# Patient Record
Sex: Male | Born: 2000 | Race: Black or African American | Hispanic: No | Marital: Single | Smoking: Never smoker
Health system: Southern US, Community
[De-identification: ages and names within clinical notes are randomized; demographics above are authoritative.]

## PROBLEM LIST (undated history)

## (undated) DIAGNOSIS — Z8619 Personal history of other infectious and parasitic diseases: Secondary | ICD-10-CM

## (undated) HISTORY — PX: OTHER SURGICAL HISTORY: SHX169

---
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
## 2020-07-07 DIAGNOSIS — H6123 Impacted cerumen, bilateral: Secondary | ICD-10-CM | POA: Diagnosis not present

## 2020-07-07 DIAGNOSIS — Z23 Encounter for immunization: Secondary | ICD-10-CM | POA: Diagnosis not present

## 2020-07-07 DIAGNOSIS — Z0001 Encounter for general adult medical examination with abnormal findings: Secondary | ICD-10-CM | POA: Diagnosis not present

## 2020-07-07 DIAGNOSIS — Z136 Encounter for screening for cardiovascular disorders: Secondary | ICD-10-CM | POA: Diagnosis not present

## 2020-07-07 DIAGNOSIS — Z113 Encounter for screening for infections with a predominantly sexual mode of transmission: Secondary | ICD-10-CM | POA: Diagnosis not present

## 2020-09-30 DIAGNOSIS — T783XXA Angioneurotic edema, initial encounter: Secondary | ICD-10-CM | POA: Diagnosis not present

## 2020-09-30 DIAGNOSIS — T781XXA Other adverse food reactions, not elsewhere classified, initial encounter: Secondary | ICD-10-CM | POA: Diagnosis not present

## 2020-09-30 DIAGNOSIS — R0602 Shortness of breath: Secondary | ICD-10-CM | POA: Diagnosis not present

## 2020-09-30 DIAGNOSIS — X58XXXA Exposure to other specified factors, initial encounter: Secondary | ICD-10-CM | POA: Diagnosis not present

## 2020-09-30 DIAGNOSIS — R220 Localized swelling, mass and lump, head: Secondary | ICD-10-CM | POA: Diagnosis not present

## 2020-10-12 DIAGNOSIS — R062 Wheezing: Secondary | ICD-10-CM | POA: Diagnosis not present

## 2020-10-12 DIAGNOSIS — J302 Other seasonal allergic rhinitis: Secondary | ICD-10-CM | POA: Diagnosis not present

## 2020-10-12 DIAGNOSIS — Z91013 Allergy to seafood: Secondary | ICD-10-CM | POA: Diagnosis not present

## 2020-10-12 DIAGNOSIS — D649 Anemia, unspecified: Secondary | ICD-10-CM | POA: Diagnosis not present

## 2020-10-18 DIAGNOSIS — T781XXA Other adverse food reactions, not elsewhere classified, initial encounter: Secondary | ICD-10-CM | POA: Diagnosis not present

## 2020-10-18 DIAGNOSIS — Z91013 Allergy to seafood: Secondary | ICD-10-CM | POA: Diagnosis not present

## 2020-10-18 DIAGNOSIS — Z91010 Allergy to peanuts: Secondary | ICD-10-CM | POA: Diagnosis not present
